# Patient Record
Sex: Male | Born: 1975 | Race: White | Hispanic: No | Marital: Married | Smoking: Never smoker
Health system: Southern US, Community
[De-identification: ages and names within clinical notes are randomized; demographics above are authoritative.]

---

## 2013-08-29 DIAGNOSIS — Z8659 Personal history of other mental and behavioral disorders: Secondary | ICD-10-CM | POA: Insufficient documentation

## 2016-12-22 ENCOUNTER — Encounter: Payer: Self-pay | Admitting: Family Medicine

## 2016-12-22 ENCOUNTER — Ambulatory Visit (INDEPENDENT_AMBULATORY_CARE_PROVIDER_SITE_OTHER): Payer: 59 | Admitting: Family Medicine

## 2016-12-22 DIAGNOSIS — M25851 Other specified joint disorders, right hip: Secondary | ICD-10-CM | POA: Insufficient documentation

## 2016-12-22 DIAGNOSIS — M25551 Pain in right hip: Secondary | ICD-10-CM | POA: Diagnosis not present

## 2016-12-22 MED ORDER — ETODOLAC 400 MG PO TABS: 400.0000 mg | ORAL_TABLET | Freq: Two times a day (BID) | ORAL | 2 refills | Status: DC | PRN

## 2016-12-22 NOTE — Progress Notes (Signed)
PCP: Patient, No Pcp Per  Subjective:   HPI: Patient is a 41 y.o. male here for right hip pain.  Patient reports he's had about 2 years of right hip pain. No acute injury or trauma. Pain is mostly dull and stiff. Worse with prolonged sitting. His pain currently is 0/10. He feels worse day after golfing. Takes etodolac which helps. No numbness, radiation. No back pain. Has been going to [facility] and had 6 sessions of stretching, dry needling with minimal benefit so far. Never had acute injury or trauma. Feels worse with full internal rotation of moving legs from externally rotated to flexed.  No past medical history on file.  No current outpatient prescriptions on file prior to visit.   No current facility-administered medications on file prior to visit.     No past surgical history on file.  No Known Allergies  Social History   Social History  . Marital status: Married    Spouse name: N/A  . Number of children: N/A  . Years of education: N/A   Occupational History  . Not on file.   Social History Main Topics  . Smoking status: Never Smoker  . Smokeless tobacco: Never Used  . Alcohol use Not on file  . Drug use: Unknown  . Sexual activity: Not on file   Other Topics Concern  . Not on file   Social History Narrative  . No narrative on file    No family history on file.  BP 115/77   Pulse 68   Ht 5\' 10"  (1.778 m)   Wt 160 lb (72.6 kg)   BMI 22.96 kg/m   Review of Systems: See HPI above.     Objective:  Physical Exam:  Gen: NAD, comfortable in exam room  Back/right hip: No gross deformity, scoliosis. No TTP of back or hip including trochanter, SI joint.  No midline or bony TTP. FROM back without pain.  Mild pain on full IR and ER of hip. Strength LEs 5/5 all muscle groups without reproduction of pain.   2+ MSRs in patellar and achilles tendons, equal bilaterally. Negative SLRs. Sensation intact to light touch bilaterally. Positive logroll on  right. Negative fabers and piriformis but fabers with very limited motion, hip pain on right.   Negative obers.  MSK u/s anterior right hip: no cortical irregularities, edema overlying proximal femur or femoral head.  No joint effusion.  Visible labrum appears normal as does iliopsoas and proximal quadriceps.  No bursitis of iliopsoas or trochanteric bursitis. Acetabulum consistent with pincer type deformity.   Assessment & Plan:  1. Right hip pain - over 2 years of hip pain and exam consistent with this as location of his pain.  No visible labral tear or bursitis.  Iliopsoas appears normal.  Ultrasound and exam consistent with FAI, pincer type.  Discussed treatment, avoidance of painful activities and full extent of stretching especially with IR and ER.  Focus on strengthening. Etodolac as needed.  Discussed MR arthrogram, surgery as well if not improving with conservative measures.  F/u in 6 weeks.

## 2016-12-22 NOTE — Assessment & Plan Note (Signed)
over 2 years of hip pain and exam consistent with this as location of his pain.  No visible labral tear or bursitis.  Iliopsoas appears normal.  Ultrasound and exam consistent with FAI, pincer type.  Discussed treatment, avoidance of painful activities and full extent of stretching especially with IR and ER.  Focus on strengthening. Etodolac as needed.  Discussed MR arthrogram, surgery as well if not improving with conservative measures.  F/u in 6 weeks.

## 2016-12-22 NOTE — Patient Instructions (Addendum)
Your ultrasound overall is reassuring - there is evidence of small anterior spur off acetabulum suggesting pincer impingement (femoroacetabular impingement). The visualized portion of the labrum looks normal. There is no muscle tear or bursitis in or around the hip. Avoid extremes of motions.   Stretching is ok but don't do full internal and external rotation as this can exacerbate symptoms. Etodolac as needed as we discussed - I sent more of this in. Strengthening is important - ok to continue with dry needling and modalities in therapy as well. If not improving over the next 3 months would consider MRI arthrogram. Typically surgery is reserved for patients who don't respond to therapy after about 6 months. Follow up with me in 6 weeks for reevaluation (out to 3 months is ok too if you're slowly improving).

## 2017-01-03 ENCOUNTER — Telehealth: Payer: Self-pay | Admitting: Family Medicine

## 2017-01-03 NOTE — Telephone Encounter (Signed)
Ok with me 

## 2017-01-03 NOTE — Telephone Encounter (Signed)
Karin Golden with [facility] called requesting an order for iontophoresis with dexamethasone for patient's right hip

## 2017-01-04 NOTE — Addendum Note (Signed)
Addended by: Kathi Simpers F on: 01/04/2017 10:06 AM   Modules accepted: Orders

## 2017-01-04 NOTE — Telephone Encounter (Signed)
Order printed and mailed. 

## 2017-02-02 ENCOUNTER — Encounter: Payer: Self-pay | Admitting: Family Medicine

## 2017-02-02 ENCOUNTER — Ambulatory Visit (INDEPENDENT_AMBULATORY_CARE_PROVIDER_SITE_OTHER): Payer: 59 | Admitting: Family Medicine

## 2017-02-02 ENCOUNTER — Ambulatory Visit (HOSPITAL_BASED_OUTPATIENT_CLINIC_OR_DEPARTMENT_OTHER)
Admission: RE | Admit: 2017-02-02 | Discharge: 2017-02-02 | Disposition: A | Discharge status: Home / Self Care | Payer: 59 | Source: Ambulatory Visit | Attending: Family Medicine | Admitting: Family Medicine

## 2017-02-02 VITALS — BP 133/83 | HR 85 | Ht 70.0 in | Wt 163.0 lb

## 2017-02-02 DIAGNOSIS — M25551 Pain in right hip: Secondary | ICD-10-CM

## 2017-02-02 DIAGNOSIS — M1611 Unilateral primary osteoarthritis, right hip: Secondary | ICD-10-CM | POA: Insufficient documentation

## 2017-02-02 NOTE — Patient Instructions (Signed)
Get x-rays downstairs as you leave today. I will call you with the results and next steps.

## 2017-02-06 NOTE — Assessment & Plan Note (Signed)
independently reviewed outside radiographs he brought in today - on AP view I was a little concerned about cortical irregularity superior portion of femoral head - ? AVN.  Those radiographs are over a year old so repeated radiographs today - no change in appearance (would expect more pain and progression if he had this).  Consistent with FAI.  He would like to continue with home exercises, avoidance of painful activities and full extent of stretching.  Etodolac as needed.  Consider MR arthrogram if does not improve with consideration of arthroscopy.  F/u in 3 months.

## 2017-02-06 NOTE — Progress Notes (Signed)
PCP: Patient, No Pcp Per  Subjective:   HPI: Patient is a 41 y.o. male here for right hip pain.  9/12: Patient reports he's had about 2 years of right hip pain. No acute injury or trauma. Pain is mostly dull and stiff. Worse with prolonged sitting. His pain currently is 0/10. He feels worse day after golfing. Takes etodolac which helps. No numbness, radiation. No back pain. [facility] and had 6 sessions of stretching, dry needling with minimal benefit so far. Never had acute injury or trauma. Feels worse with full internal rotation of moving legs from externally rotated to flexed.  10/24: Patient returns today with some improvement since last visit. Work has been busy for him so he hasn't been as diligent with his home exercises and noticed pain seems to be a little worse than when he was able to do them regularly. Pain is 0/10 but up to 7/10 and sharp at worst. Pain with sitting a long time. Can get a rare sudden sharper pain as well. Pain anterior, lateral right hip. No skin changes, numbness.  No past medical history on file.  Current Outpatient Prescriptions on File Prior to Visit  Medication Sig Dispense Refill  . etodolac (LODINE) 400 MG tablet Take 1 tablet (400 mg total) by mouth 2 (two) times daily as needed. 60 tablet 2   No current facility-administered medications on file prior to visit.     No past surgical history on file.  No Known Allergies  Social History   Social History  . Marital status: Married    Spouse name: N/A  . Number of children: N/A  . Years of education: N/A   Occupational History  . Not on file.   Social History Main Topics  . Smoking status: Never Smoker  . Smokeless tobacco: Never Used  . Alcohol use Not on file  . Drug use: Unknown  . Sexual activity: Not on file   Other Topics Concern  . Not on file   Social History Narrative  . No narrative on file    No family history on file.  BP 133/83   Pulse  85   Ht 5\' 10"  (1.778 m)   Wt 163 lb (73.9 kg)   BMI 23.39 kg/m   Review of Systems: See HPI above.     Objective:  Physical Exam:  Gen: NAD, comfortable in exam room.  Back: No gross deformity, scoliosis. No TTP .  No midline or bony TTP. FROM without pain. Strength LEs 5/5 all muscle groups.   Negative SLRs. Sensation intact to light touch bilaterally.  Right hip: No gross deformity. No TTP. Mild limitation IR and ER with pain on both. Strength 5/5. Positive logroll right hip. Negative fabers and piriformis stretches.  Assessment & Plan:  1. Right hip pain - independently reviewed outside radiographs he brought in today - on AP view I was a little concerned about cortical irregularity superior portion of femoral head - ? AVN.  Those radiographs are over a year old so repeated radiographs today - no change in appearance (would expect more pain and progression if he had this).  Consistent with FAI.  He would like to continue with home exercises, avoidance of painful activities and full extent of stretching.  Etodolac as needed.  Consider MR arthrogram if does not improve with consideration of arthroscopy.  F/u in 3 months.

## 2018-01-15 ENCOUNTER — Other Ambulatory Visit: Payer: Self-pay | Admitting: Family Medicine

## 2018-11-30 NOTE — Progress Notes (Signed)
Zachary Hanson - 43 y.o. male MRN [id number]  Date of birth: 01/07/1976  SUBJECTIVE:  Including CC & ROS.  Chief Complaint  Patient presents with  . Follow-up    follow up for right hip    Zachary Hanson is a 43 y.o. male that is presenting with acute on chronic right hip pain.  The pain is been ongoing for a few years now.  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  The pain is occurring over the anterior proximal hip joint itself.  The pain is worse with sitting for prolonged periods of time.  It is worse with certain movements.  He has significant pain after walking with cough.  No significant injury occurring.  Has been ongoing for a few years.  Has tried medications with limited no improvement.  Pain is localized to this area.  No radicular symptoms.  Independent review of the pelvis and right hip xray from 2018 shows mild degenerative changes of the hip.    Review of Systems  Constitutional: Negative for fever.  HENT: Negative for congestion.   Respiratory: Negative for cough.   Cardiovascular: Negative for chest pain.  Gastrointestinal: Negative for abdominal pain.  Musculoskeletal: Negative for back pain.  Skin: Negative for color change.  Neurological: Negative for weakness.  Hematological: Negative for adenopathy.    HISTORY: Past Medical, Surgical, Social, and Family History Reviewed & Updated per EMR.   Pertinent Historical Findings include:  History reviewed. No pertinent past medical history.  History reviewed. No pertinent surgical history.  No Known Allergies  History reviewed. No pertinent family history.   Social History   Socioeconomic History  . Marital status: Married    Spouse name: Not on file  . Number of children: Not on file  . Years of education: Not on file  . Highest education level: Not on file  Occupational History  . Not on file  Social Needs  . Financial resource strain: Not on file  . Food  insecurity    Worry: Not on file    Inability: Not on file  . Transportation needs    Medical: Not on file    Non-medical: Not on file  Tobacco Use  . Smoking status: Never Smoker  . Smokeless tobacco: Never Used  Substance and Sexual Activity  . Alcohol use: Not on file  . Drug use: Not on file  . Sexual activity: Not on file  Lifestyle  . Physical activity    Days per week: Not on file    Minutes per session: Not on file  . Stress: Not on file  Relationships  . Social Herbalist on phone: Not on file    Gets together: Not on file    Attends religious service: Not on file    Active member of club or organization: Not on file    Attends meetings of clubs or organizations: Not on file    Relationship status: Not on file  . Intimate partner violence    Fear of current or ex partner: Not on file    Emotionally abused: Not on file    Physically abused: Not on file    Forced sexual activity: Not on file  Other Topics Concern  . Not on file  Social History Narrative  . Not on file     PHYSICAL EXAM:  VS: BP 118/80   Ht 5\' 10"  (1.778 m)   Wt 160 lb (72.6  kg)   BMI 22.96 kg/m  Physical Exam Gen: NAD, alert, cooperative with exam, well-appearing ENT: normal lips, normal nasal mucosa,  Eye: normal EOM, normal conjunctiva and lids CV:  no edema, +2 pedal pulses   Resp: no accessory muscle use, non-labored,  Skin: no rashes, no areas of induration  Neuro: normal tone, normal sensation to touch Psych:  normal insight, alert and oriented MSK:  Right hip: No tenderness palpation of the greater trochanter. Normal external rotation. Limited internal rotation. Normal strength resistance with hip flexion. Normal strength with hip abduction. Positive FADIR Normal FABER  NVI    Aspiration/Injection Procedure Note Zachary Hanson 10/04/1975  Procedure: Injection Indications: right hip pain   Procedure Details Consent: Risks of procedure as well as the  alternatives and risks of each were explained to the (patient/caregiver).  Consent for procedure obtained. Time Out: Verified patient identification, verified procedure, site/side was marked, verified correct patient position, special equipment/implants available, medications/allergies/relevent history reviewed, required imaging and test results available.  Performed.  The area was cleaned with iodine and alcohol swabs.    The right hip joint was injected using 1 cc's of 40 mg Kenalog and 4 cc's of 0.25% bupivacaine with a 22 3 1/2" needle.  Ultrasound was used. Images were obtained in long views showing the injection.     A sterile dressing was applied.  Patient did tolerate procedure well.       ASSESSMENT & PLAN:   Right hip pain Pain is ongoing and chronic in nature.  Has failed conservative measures.  Possible for impingement at this point.  Less likely for arthritic change. -Injection. -MRI to evaluate for impingement versus labral pathology. -Counseled on supportive care.

## 2018-12-01 ENCOUNTER — Ambulatory Visit: Payer: 59 | Admitting: Family Medicine

## 2018-12-01 ENCOUNTER — Ambulatory Visit: Payer: Self-pay

## 2018-12-01 ENCOUNTER — Other Ambulatory Visit: Payer: Self-pay

## 2018-12-01 ENCOUNTER — Encounter: Payer: Self-pay | Admitting: Family Medicine

## 2018-12-01 VITALS — BP 118/80 | Ht 70.0 in | Wt 160.0 lb

## 2018-12-01 DIAGNOSIS — M25551 Pain in right hip: Secondary | ICD-10-CM

## 2018-12-01 MED ORDER — TRIAMCINOLONE ACETONIDE 40 MG/ML IJ SUSP
40.0000 mg | Freq: Once | INTRAMUSCULAR | Status: AC
  Administered 2018-12-01: 10:00:00 40 mg via INTRA_ARTICULAR

## 2018-12-01 NOTE — Addendum Note (Signed)
Addended by: Sherrie George F on: 12/01/2018 09:46 AM   Modules accepted: Orders

## 2018-12-01 NOTE — Patient Instructions (Signed)
Nice to meet you You will get a call to schedule the MRI.  Please ice the area  Please send me a message in MyChart with any questions or updates.  Please schedule a virtual visit or in person visit once the MRI is completed.   --Dr. Raeford Razor

## 2018-12-01 NOTE — Assessment & Plan Note (Signed)
Pain is ongoing and chronic in nature.  Has failed conservative measures.  Possible for impingement at this point.  Less likely for arthritic change. -Injection. -MRI to evaluate for impingement versus labral pathology. -Counseled on supportive care.

## 2019-01-21 IMAGING — CR DG HIP (WITH OR WITHOUT PELVIS) 2-3V*R*
3 series · 3 of 3 positions shown · non-contrast
Comparison: None.

CLINICAL DATA: Right hip pain for 3 years.  No known injury.

EXAM:
DG HIP (WITH OR WITHOUT PELVIS) 2-3V RIGHT

[t pelvis a.p.]
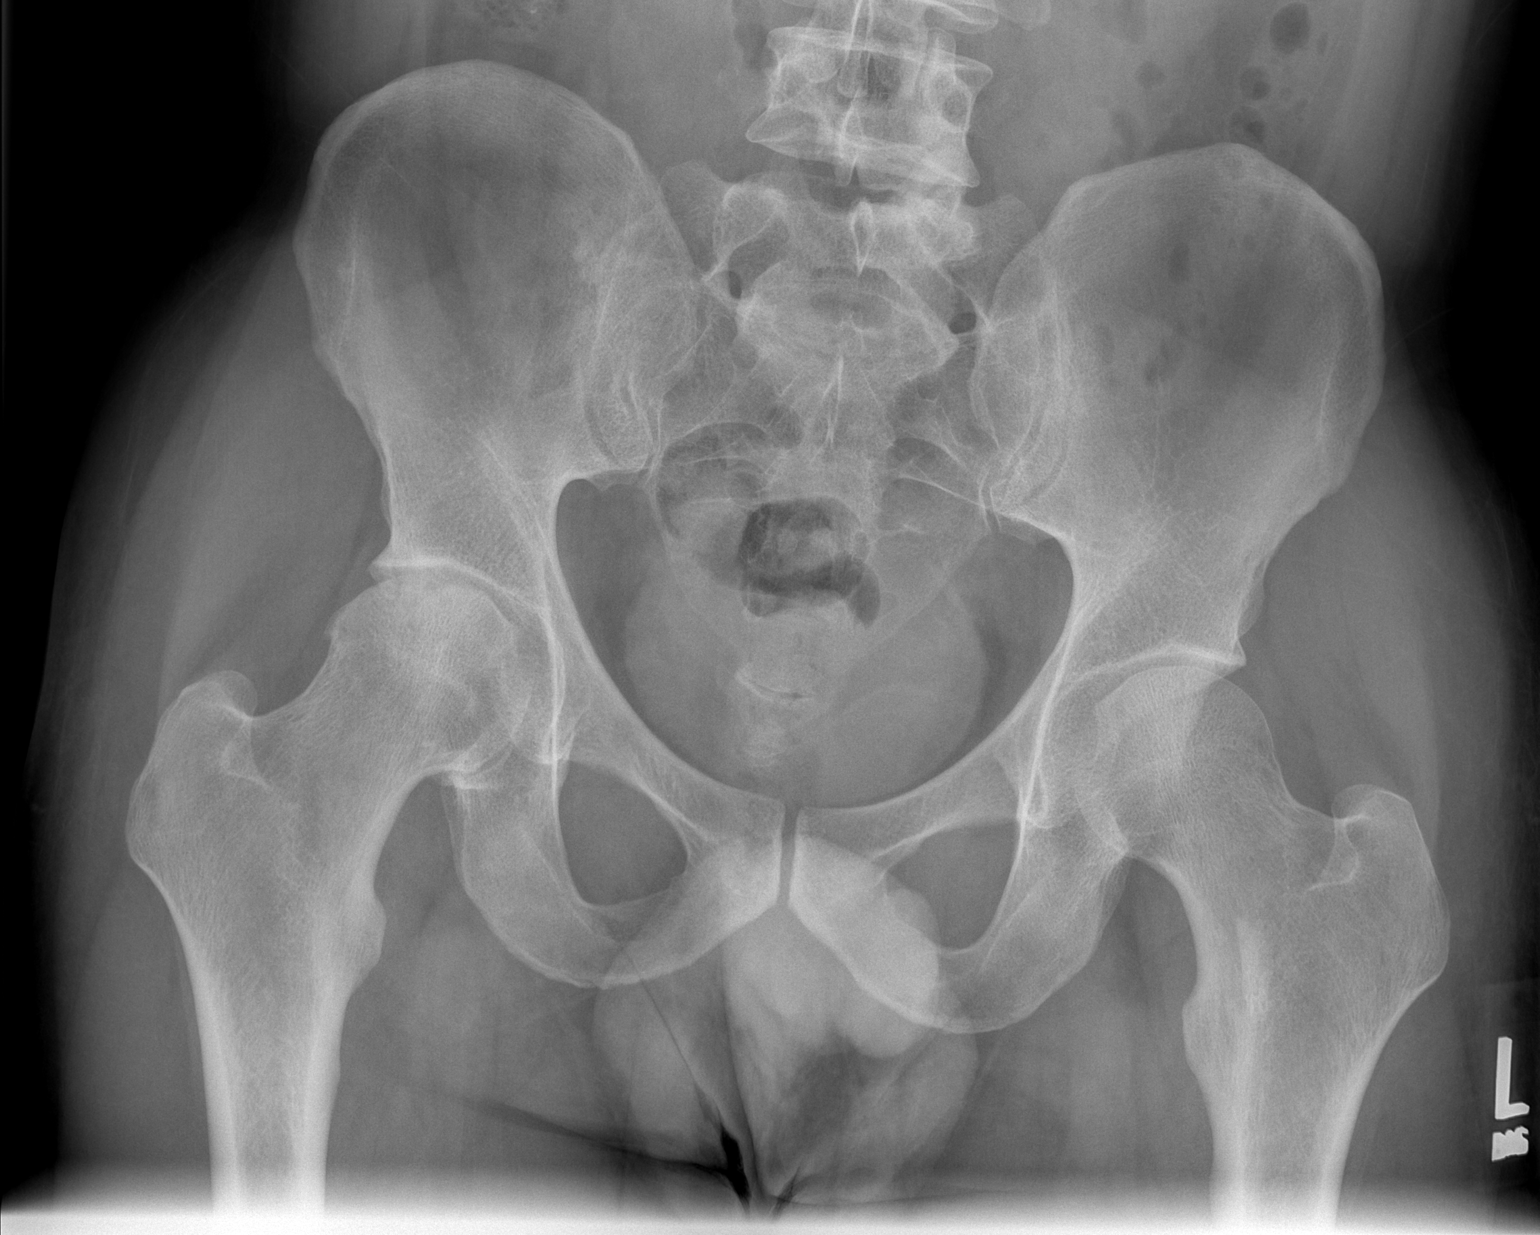

[t hip ap right]
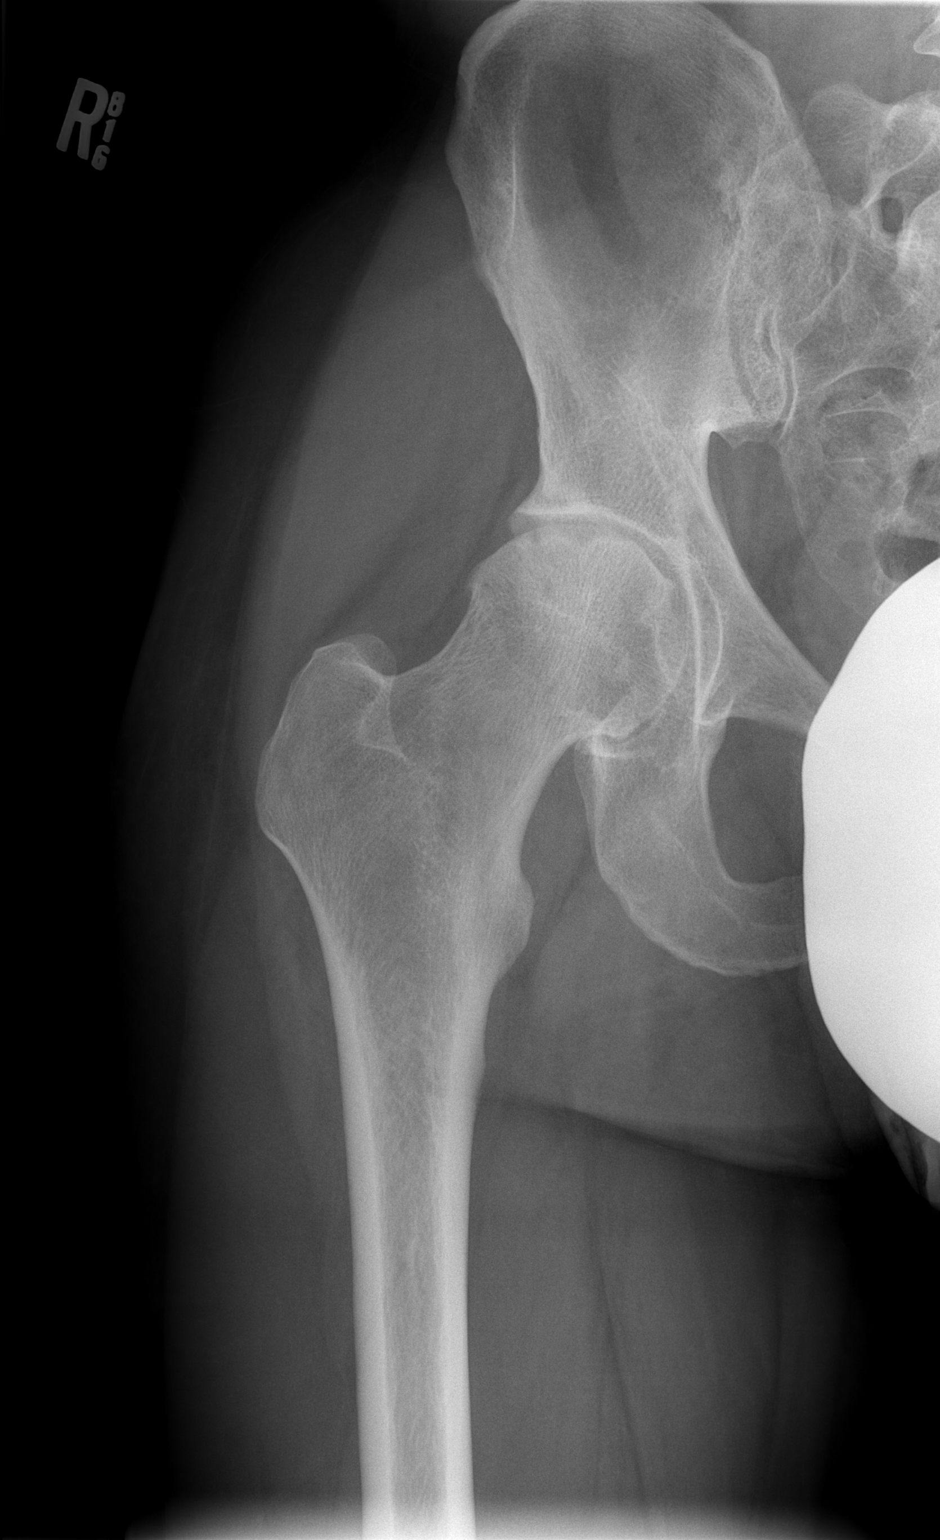

[t hip frog leg right]
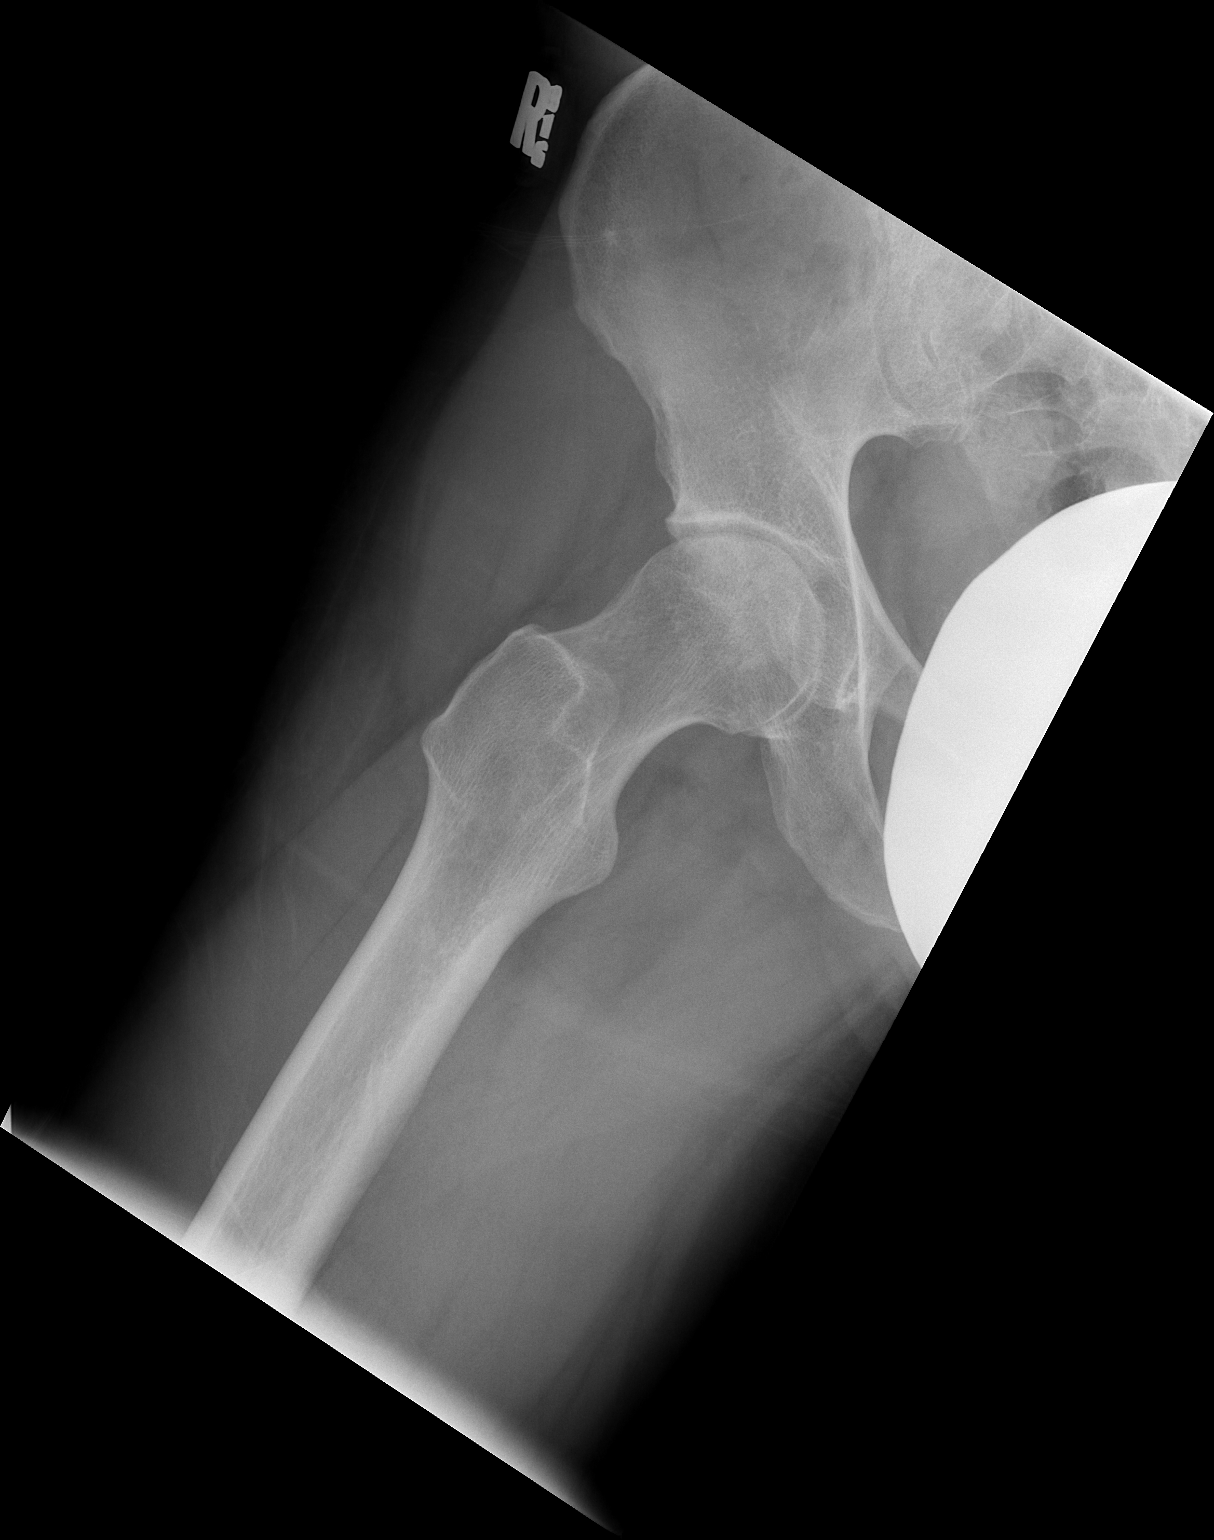

[3 of 3 positions shown; findings below may reference images not displayed]

FINDINGS: There is joint space narrowing within the right hip joint. Early
spurring and subchondral sclerosis. No acute fracture, subluxation
or dislocation.
IMPRESSION: Mild osteoarthritic changes in the right hip. No acute bony
abnormality.

## 2019-06-26 ENCOUNTER — Other Ambulatory Visit: Payer: Self-pay

## 2019-06-26 ENCOUNTER — Ambulatory Visit (INDEPENDENT_AMBULATORY_CARE_PROVIDER_SITE_OTHER): Payer: 59 | Admitting: Family Medicine

## 2019-06-26 ENCOUNTER — Ambulatory Visit: Payer: Self-pay

## 2019-06-26 VITALS — BP 120/80 | HR 80 | Ht 70.0 in | Wt 162.0 lb

## 2019-06-26 DIAGNOSIS — M25851 Other specified joint disorders, right hip: Secondary | ICD-10-CM

## 2019-06-26 MED ORDER — TRIAMCINOLONE ACETONIDE 40 MG/ML IJ SUSP
40.0000 mg | Freq: Once | INTRAMUSCULAR | Status: AC
  Administered 2019-06-26: 40 mg via INTRA_ARTICULAR

## 2019-06-26 NOTE — Progress Notes (Signed)
Zachary Hanson - 44 y.o. male MRN [id number]  Date of birth: [birth year]  SUBJECTIVE:  Including CC & ROS.  Chief Complaint  Patient presents with  . Follow-up    right hip    Zachary Hanson is a 44 y.o. male that is presenting with acute right hip pain.  He received a steroid injection last fall and that has helped until recently.  The pain is occurring over the inside of the groin.  He notices it more with walking and playing golf.  No inciting event or trauma.  Pain can be severe.  Localized to the hip joint.   Review of Systems See HPI   HISTORY: Past Medical, Surgical, Social, and Family History Reviewed & Updated per EMR.   Pertinent Historical Findings include:  No past medical history on file.  No past surgical history on file.  No family history on file.  Social History   Socioeconomic History  . Marital status: Married    Spouse name: Not on file  . Number of children: Not on file  . Years of education: Not on file  . Highest education level: Not on file  Occupational History  . Not on file  Tobacco Use  . Smoking status: Never Smoker  . Smokeless tobacco: Never Used  Substance and Sexual Activity  . Alcohol use: Not on file  . Drug use: Not on file  . Sexual activity: Not on file  Other Topics Concern  . Not on file  Social History Narrative  . Not on file   Social Determinants of Health   Financial Resource Strain:   . Difficulty of Paying Living Expenses:   Food Insecurity:   . Worried About Programme researcher, broadcasting/film/video in the Last Year:   . Barista in the Last Year:   Transportation Needs:   . Freight forwarder (Medical):   Marland Kitchen Lack of Transportation (Non-Medical):   Physical Activity:   . Days of Exercise per Week:   . Minutes of Exercise per Session:   Stress:   . Feeling of Stress :   Social Connections:   . Frequency of Communication with Friends and Family:   . Frequency of Social Gatherings with Friends and Family:   . Attends  Religious Services:   . Active Member of Clubs or Organizations:   . Attends Banker Meetings:   Marland Kitchen Marital Status:   Intimate Partner Violence:   . Fear of Current or Ex-Partner:   . Emotionally Abused:   Marland Kitchen Physically Abused:   . Sexually Abused:      PHYSICAL EXAM:  VS: BP 120/80   Pulse 80   Ht 5\' 10"  (1.778 m)   Wt 162 lb (73.5 kg)   BMI 23.24 kg/m  Physical Exam Gen: NAD, alert, cooperative with exam, well-appearing MSK:  Right hip: Normal internal and external rotation. Normal strength resistance. Normal FABER  Some pain with FADIR  Neurovascularly intact   Aspiration/Injection Procedure Note Zachary Hanson 1976/03/24  Procedure: Injection Indications: Right hip pain  Procedure Details Consent: Risks of procedure as well as the alternatives and risks of each were explained to the (patient/caregiver).  Consent for procedure obtained. Time Out: Verified patient identification, verified procedure, site/side was marked, verified correct patient position, special equipment/implants available, medications/allergies/relevent history reviewed, required imaging and test results available.  Performed.  The area was cleaned with iodine and alcohol swabs.    The right hip joint was injected using 1  cc's of 40 mg Kenalog and 4 cc's of 0.25% bupivacaine with a 22 3 1/2" needle.  Ultrasound was used. Images were obtained in long views showing the injection.     A sterile dressing was applied.  Patient did tolerate procedure well.     ASSESSMENT & PLAN:   Femoroacetabular impingement of right hip Pain still seems likely due to impingement.  Previous imaging did not show significant degenerative change of the joint.  Has only recently started hurting again. -Injection. -Counseled on home exercise therapy and supportive care. -Provided Duexis samples. -Could consider MRI or physical therapy.

## 2019-06-26 NOTE — Patient Instructions (Signed)
Good to see you Please try ice  Please try the exercises  Please use the duexis as needed   Please send me a message in MyChart with any questions or updates.  Please see me back in 4-6 weeks or as needed.   --Dr. Jordan Likes

## 2019-06-26 NOTE — Progress Notes (Signed)
Medication Samples have been provided to the patient.  Drug name: Duexis       Strength: 800mg /26.6mg         Qty: 2 Boxes  LOT  Exp.Date: 05/2020  Dosing instructions: Take 1 tablet by mouth three (3) times a day.  The patient has been instructed regarding the correct time, dose, and frequency of taking this medication, including desired effects and most common side effects.   06/2020, Kathi Simpers 12:09 PM 06/26/2019

## 2019-06-26 NOTE — Assessment & Plan Note (Signed)
Pain still seems likely due to impingement.  Previous imaging did not show significant degenerative change of the joint.  Has only recently started hurting again. -Injection. -Counseled on home exercise therapy and supportive care. -Provided Duexis samples. -Could consider MRI or physical therapy.

## 2022-07-29 ENCOUNTER — Encounter: Payer: Self-pay | Admitting: *Deleted

## 2023-08-12 ENCOUNTER — Encounter (HOSPITAL_BASED_OUTPATIENT_CLINIC_OR_DEPARTMENT_OTHER): Payer: Self-pay

## 2023-08-12 ENCOUNTER — Ambulatory Visit (HOSPITAL_BASED_OUTPATIENT_CLINIC_OR_DEPARTMENT_OTHER): Admission: EM | Admit: 2023-08-12 | Discharge: 2023-08-12 | Disposition: A | Discharge status: Home / Self Care

## 2023-08-12 ENCOUNTER — Other Ambulatory Visit (HOSPITAL_BASED_OUTPATIENT_CLINIC_OR_DEPARTMENT_OTHER): Payer: Self-pay

## 2023-08-12 DIAGNOSIS — J029 Acute pharyngitis, unspecified: Secondary | ICD-10-CM | POA: Diagnosis not present

## 2023-08-12 DIAGNOSIS — J0140 Acute pansinusitis, unspecified: Secondary | ICD-10-CM

## 2023-08-12 DIAGNOSIS — H66002 Acute suppurative otitis media without spontaneous rupture of ear drum, left ear: Secondary | ICD-10-CM

## 2023-08-12 LAB — POCT RAPID STREP A (OFFICE): Rapid Strep A Screen: NEGATIVE

## 2023-08-12 MED ORDER — CEFDINIR 300 MG PO CAPS
300.0000 mg | ORAL_CAPSULE | Freq: Two times a day (BID) | ORAL | 0 refills | Status: AC
  Filled 2023-08-12: qty 14, 7d supply, fill #0

## 2023-08-12 MED ORDER — FLUTICASONE PROPIONATE 50 MCG/ACT NA SUSP
1.0000 | Freq: Every day | NASAL | 0 refills | Status: AC | PRN
  Filled 2023-08-12: qty 16, 30d supply, fill #0

## 2023-08-12 NOTE — Discharge Instructions (Addendum)
 Rapid strep was negative.  Exam is consistent with early sinusitis and left ear infection.  Will treat with cefdinir , 300 mg twice daily for 7 days.  Continue Nettie pot use twice daily.  Fluticasone  nasal spray, 1 spray into each nostril daily as needed for nasal congestion.  Get plenty of fluids and rest.  Follow-up here or with primary care if symptoms do not improve, worsen or new symptoms occur.

## 2023-08-12 NOTE — ED Provider Notes (Signed)
 [facility]    CSN: [id number] Arrival date & time: 08/12/23  0801      History   Chief Complaint Chief Complaint  Patient presents with   Sore Throat    HPI Zachary Hanson is a 48 y.o. male.   Patient reports nasal congestion with mostly clear or maybe slightly discolored nasal discharge with sinus pressure and pain and frontal headache for approximately 1 week starting on 08/04/2023.  He developed a sore throat on 08/10/2023 and it is getting worse every day and it was significantly worse this morning.  He has pain with swallowing even liquids.  He has not taken anything for the sore throat.  He has been using a Nettie pot for his sinuses.  He has used nasal sprays like fluticasone  in the past but not recently.   Sore Throat Associated symptoms include headaches. Pertinent negatives include no chest pain and no abdominal pain.    History reviewed. No pertinent past medical history.  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 No pertinent surgical history.     Home Medications    Prior to Admission medications   Medication Sig Start Date End Date Taking? Authorizing Provider  cefdinir  (OMNICEF ) 300 MG capsule Take 1 capsule (300 mg total) by mouth 2 (two) times daily for 7 days. 08/12/23 08/19/23 Yes Guss Legacy, FNP  fluticasone  (FLONASE ) 50 MCG/ACT nasal spray Place 1 spray into both nostrils daily as needed for rhinitis. 08/12/23 09/11/23 Yes Guss Legacy, FNP  sertraline (ZOLOFT) 100 MG tablet Take 100 mg by mouth daily.    [provider]    Family History History reviewed. No pertinent family history.  Social History Social History   Tobacco Use   Smoking status: Never   Smokeless tobacco: Never  Vaping Use   Vaping status: Never Used  Substance Use Topics   Alcohol use: Yes    Comment: occ   Drug use: Not  Currently     Allergies   Patient has no known allergies.   Review of Systems Review of Systems  Constitutional:  Negative for chills and fever.  HENT:  Positive for ear pain (Fullness and pressure), postnasal drip, rhinorrhea, sinus pressure, sinus pain and sore throat.   Eyes:  Negative for pain and visual disturbance.  Respiratory:  Negative for cough.   Cardiovascular:  Negative for chest pain and palpitations.  Gastrointestinal:  Negative for abdominal pain, constipation, diarrhea, nausea and vomiting.  Genitourinary:  Negative for dysuria and hematuria.  Musculoskeletal:  Negative for arthralgias and back pain.  Skin:  Negative for color change and rash.  Neurological:  Positive for headaches. Negative for seizures and syncope.  All other systems reviewed and are negative.    Physical Exam Triage Vital Signs ED Triage Vitals [08/12/23 0817]  Encounter Vitals Group     BP 120/85     Systolic BP Percentile      Diastolic BP Percentile      Pulse Rate 79     Resp 18     Temp 98.1 F (36.7 C)     Temp Source Oral     SpO2 97 %     Weight      Height      Head Circumference      Peak Flow      Pain Score 5  Pain Loc      Pain Education      Exclude from Growth Chart    No data found.  Updated Vital Signs BP 120/85 (BP Location: Right Arm)   Pulse 79   Temp 98.1 F (36.7 C) (Oral)   Resp 18   SpO2 97%   Visual Acuity Right Eye Distance:   Left Eye Distance:   Bilateral Distance:    Right Eye Near:   Left Eye Near:    Bilateral Near:     Physical Exam Vitals and nursing note reviewed.  Constitutional:      General: He is not in acute distress.    Appearance: He is well-developed. He is not ill-appearing or toxic-appearing.  HENT:     Head: Normocephalic and atraumatic.     Right Ear: Hearing, tympanic membrane, ear canal and external ear normal.     Left Ear: Hearing, ear canal and external ear normal. A middle ear effusion is present.  Tympanic membrane is erythematous.     Nose: Congestion and rhinorrhea present. Rhinorrhea is clear.     Right Sinus: Maxillary sinus tenderness (mild) and frontal sinus tenderness (mild) present.     Left Sinus: Maxillary sinus tenderness (mild) and frontal sinus tenderness (mild) present.     Mouth/Throat:     Lips: Pink.     Mouth: Mucous membranes are moist.     Pharynx: Uvula midline. Posterior oropharyngeal erythema present. No oropharyngeal exudate.     Tonsils: No tonsillar exudate.  Eyes:     Conjunctiva/sclera: Conjunctivae normal.     Pupils: Pupils are equal, round, and reactive to light.  Cardiovascular:     Rate and Rhythm: Normal rate and regular rhythm.     Heart sounds: S1 normal and S2 normal. No murmur heard. Pulmonary:     Effort: Pulmonary effort is normal. No respiratory distress.     Breath sounds: Normal breath sounds. No decreased breath sounds, wheezing, rhonchi or rales.  Abdominal:     General: Bowel sounds are normal.     Palpations: Abdomen is soft.     Tenderness: There is no abdominal tenderness.  Musculoskeletal:        General: No swelling.     Cervical back: Neck supple.  Lymphadenopathy:     Head:     Right side of head: Tonsillar adenopathy present. No submental, submandibular, preauricular or posterior auricular adenopathy.     Left side of head: Tonsillar adenopathy present. No submental, submandibular, preauricular or posterior auricular adenopathy.     Cervical: No cervical adenopathy.     Right cervical: No superficial cervical adenopathy.    Left cervical: No superficial cervical adenopathy.  Skin:    General: Skin is warm and dry.     Capillary Refill: Capillary refill takes less than 2 seconds.     Findings: No rash.  Neurological:     Mental Status: He is alert and oriented to person, place, and time.  Psychiatric:        Mood and Affect: Mood normal.      UC Treatments / Results  Labs (all labs ordered are listed, but only  abnormal results are displayed) Labs Reviewed  POCT RAPID STREP A (OFFICE) - Normal    EKG   Radiology No results found.  Procedures Procedures (including critical care time)  Medications Ordered in UC Medications - No data to display  Initial Impression / Assessment and Plan / UC Course  I have reviewed the triage  vital signs and the nursing notes.  Pertinent labs & imaging results that were available during my care of the patient were reviewed by me and considered in my medical decision making (see chart for details).     Plan of Care: Left otitis media: Will treat with cefdinir , 300 mg twice daily for 7 days.  Get plenty of fluids and rest. None recurrent sinusitis: Continue to use Nettie pot once or twice daily.  Getting cefdinir  for otitis media and it will help the sinuses.  Fluticasone , 1 spray into each nostril once or twice daily as needed for congestion. Sore throat: Rapid strep was negative.  Throat culture not sent because the patient's going to be on antibiotics for otitis media anyway.  Follow-up if symptoms do not improve, worsen or new symptoms occur. Final Clinical Impressions(s) / UC Diagnoses   Final diagnoses:  Acute non-recurrent pansinusitis  Non-recurrent acute suppurative otitis media of left ear without spontaneous rupture of tympanic membrane  Sore throat     Discharge Instructions      Rapid strep was negative.  Exam is consistent with early sinusitis and left ear infection.  Will treat with cefdinir , 300 mg twice daily for 7 days.  Continue Nettie pot use twice daily.  Fluticasone  nasal spray, 1 spray into each nostril daily as needed for nasal congestion.  Get plenty of fluids and rest.  Follow-up here or with primary care if symptoms do not improve, worsen or new symptoms occur.     ED Prescriptions     Medication Sig Dispense Auth. Provider   cefdinir  (OMNICEF ) 300 MG capsule Take 1 capsule (300 mg total) by mouth 2 (two) times daily  for 7 days. 14 capsule Guss Legacy, FNP   fluticasone  (FLONASE ) 50 MCG/ACT nasal spray Place 1 spray into both nostrils daily as needed for rhinitis. 16 g Guss Legacy, FNP      PDMP not reviewed this encounter.   Guss Legacy, FNP 08/12/23 1031

## 2023-08-12 NOTE — ED Triage Notes (Signed)
 Pt states had sinusitis a week ago with a sore throat. States this morning the sore throat is 5x's worse. States pain on swallowing. Denies taking any meds today.
